# Patient Record
Sex: Male | Born: 2000 | Race: White | Hispanic: No | Marital: Single | Smoking: Never smoker
Health system: Southern US, Community
[De-identification: ages and names within clinical notes are randomized; demographics above are authoritative.]

## PROBLEM LIST (undated history)

## (undated) DIAGNOSIS — S62101B Fracture of unspecified carpal bone, right wrist, initial encounter for open fracture: Secondary | ICD-10-CM

---
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
## 2020-08-22 IMAGING — DX DG WRIST COMPLETE 3+V*L*
4 series · 4 of 4 positions shown · non-contrast
Comparison: None.

CLINICAL DATA: Left wrist pain.  Fall.

EXAM:
LEFT WRIST - COMPLETE 3+ VIEW

[wrist ap (1 of 2)]
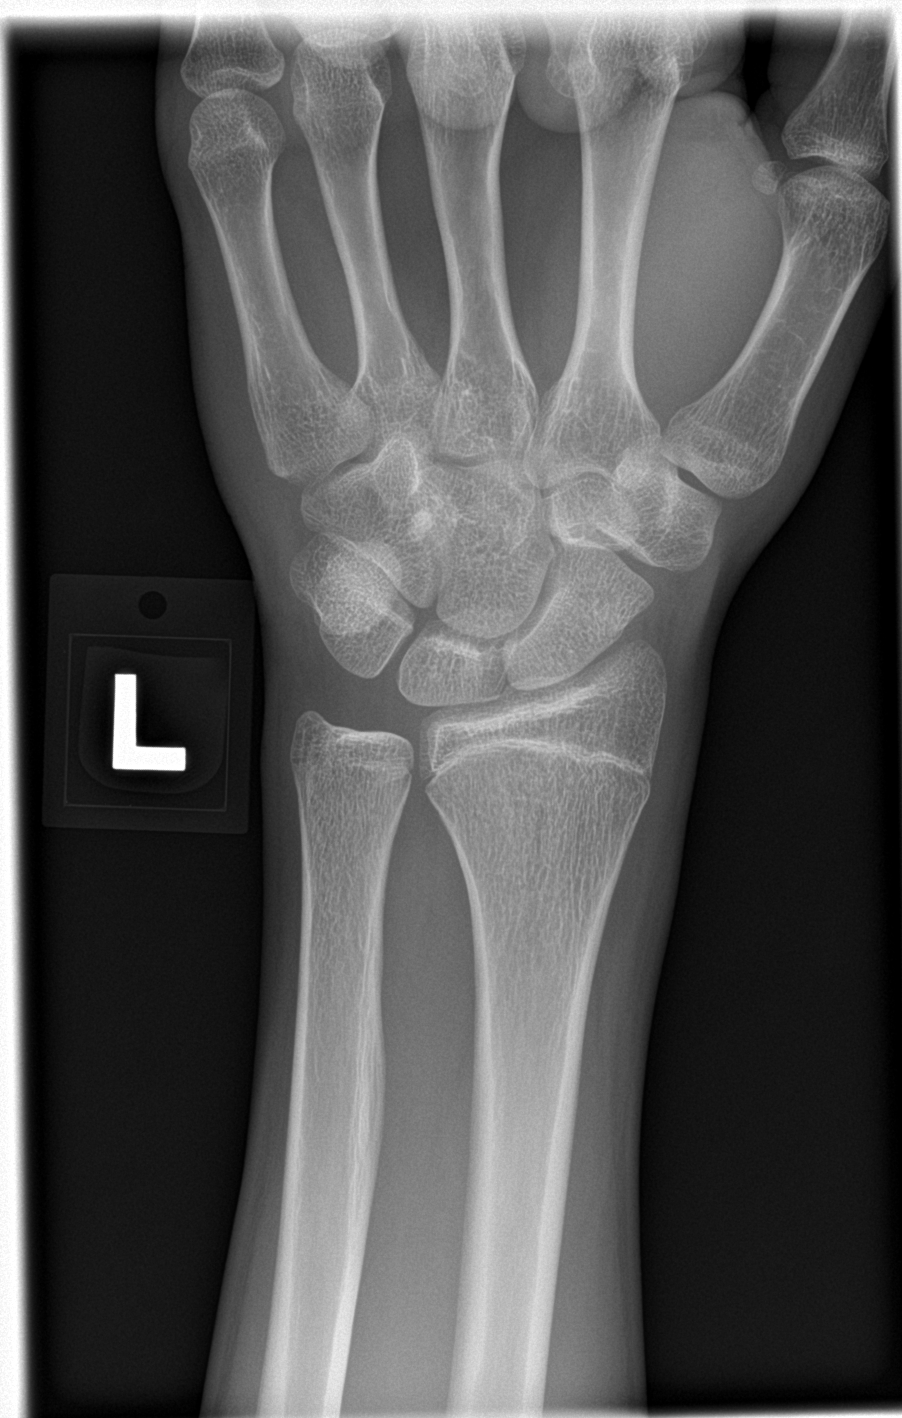

[wrist obl]
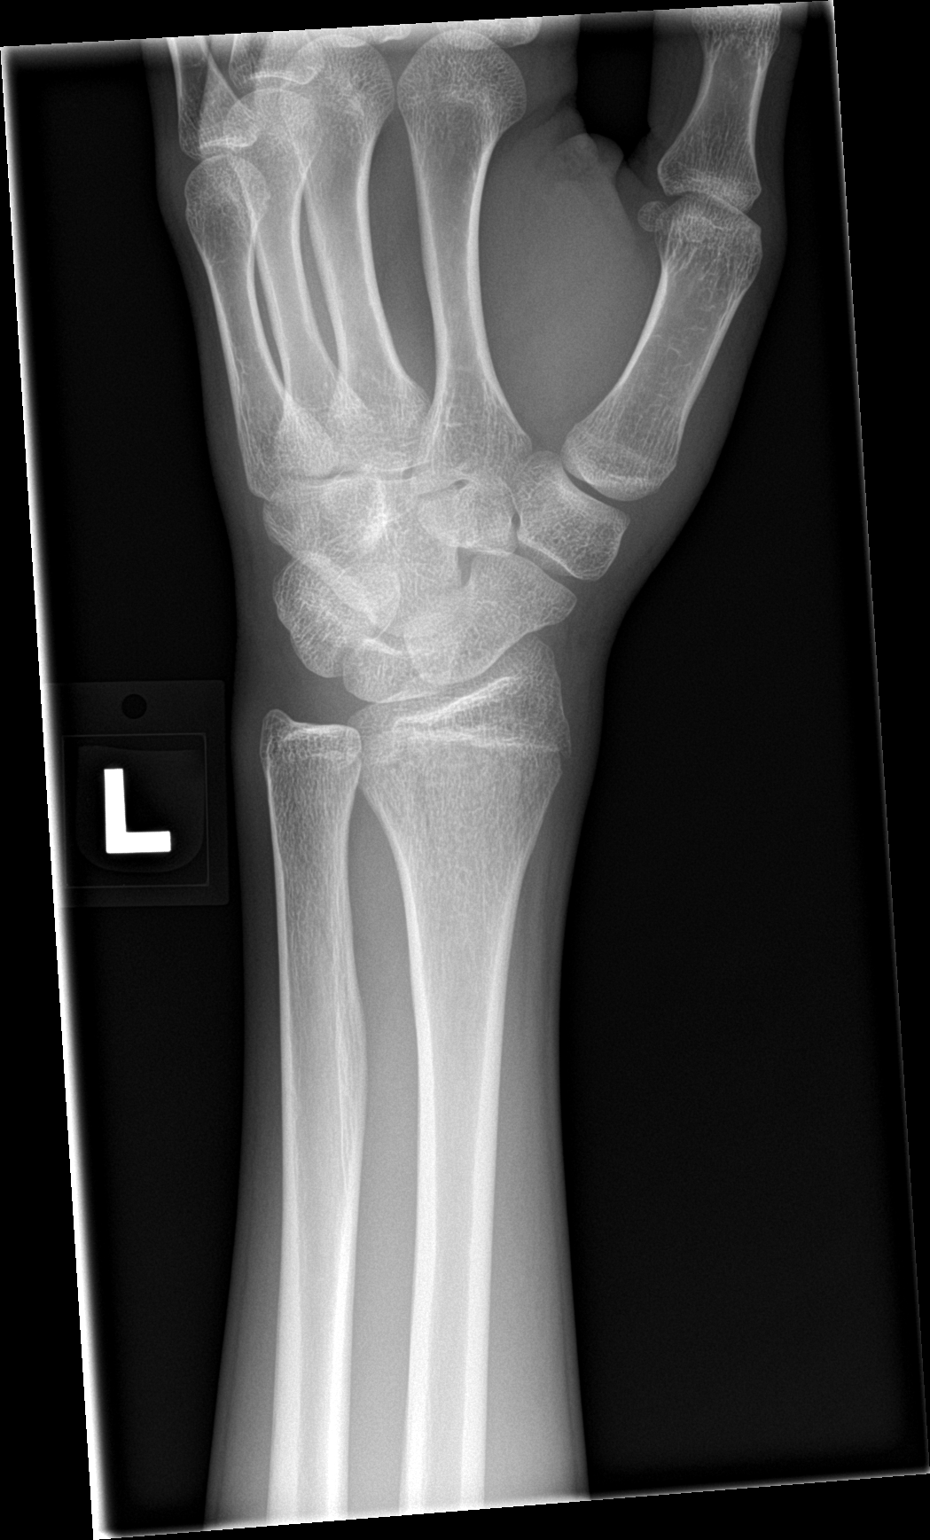

[wrist lat]
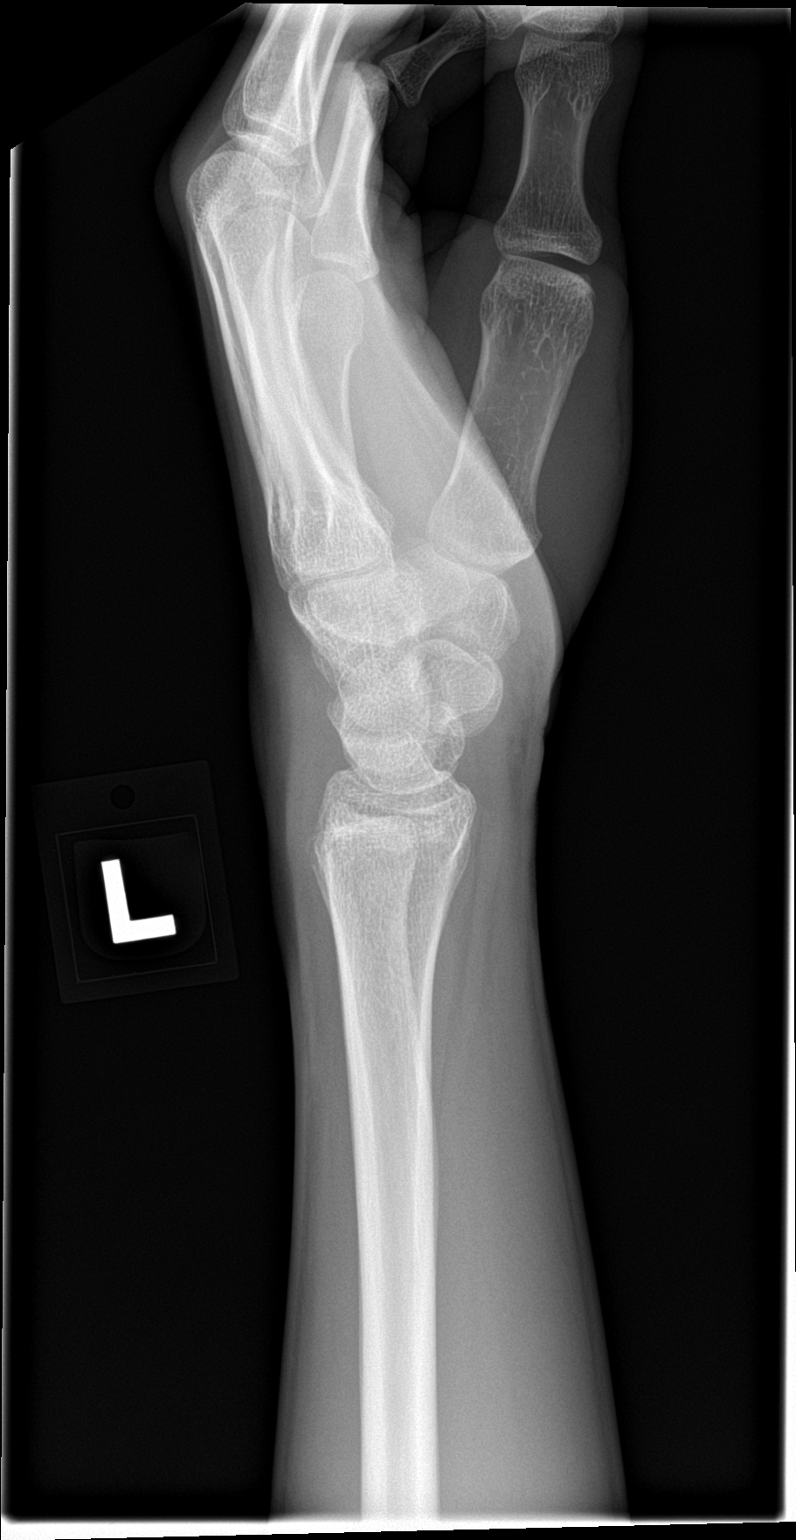

[wrist ap (2 of 2)]
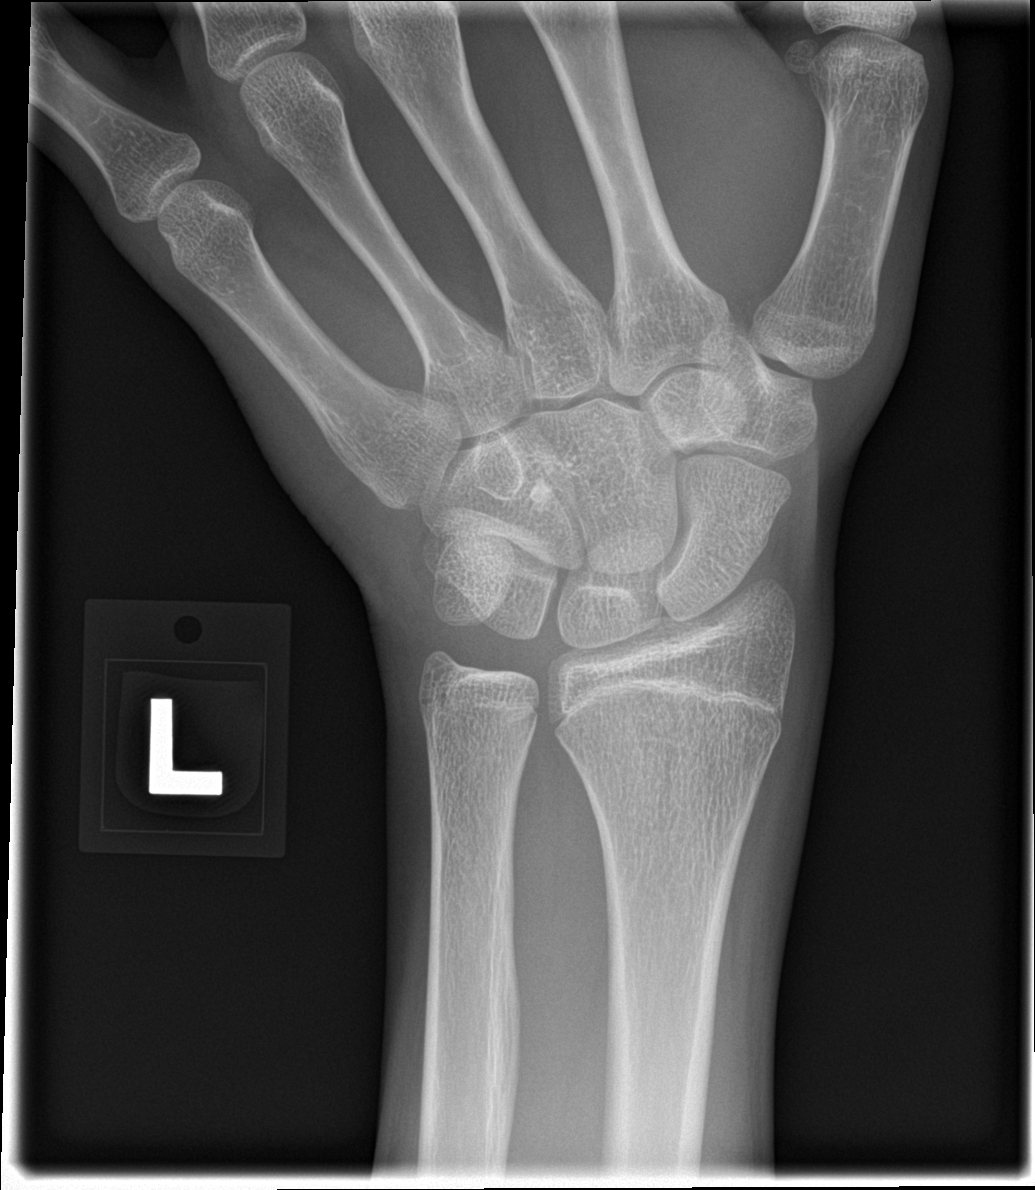

[4 of 4 positions shown; findings below may reference images not displayed]

FINDINGS: There is no evidence of fracture or dislocation. There is no
evidence of arthropathy or other focal bone abnormality. Soft
tissues are unremarkable.
IMPRESSION: Negative.

## 2022-08-05 ENCOUNTER — Ambulatory Visit (INDEPENDENT_AMBULATORY_CARE_PROVIDER_SITE_OTHER): Payer: BLUE CROSS/BLUE SHIELD | Admitting: Adult Health

## 2022-08-05 ENCOUNTER — Encounter: Payer: Self-pay | Admitting: Adult Health

## 2022-08-05 VITALS — BP 97/65 | HR 68 | Temp 96.7°F | Ht 75.0 in | Wt 180.0 lb

## 2022-08-05 DIAGNOSIS — S069X0A Unspecified intracranial injury without loss of consciousness, initial encounter: Secondary | ICD-10-CM | POA: Diagnosis not present

## 2022-08-05 NOTE — Progress Notes (Signed)
[facility] [place], [place] [address] Phone: [phone] Fax: [phone]   Office Visit Note  Patient Name: Aaron Jefferson  Date of Birth [birth year]  Med Record Number [id number]  Date of Service: 08/05/2022  Chief Complaint  Patient presents with   Concussion    Vital Signs: BP 97/65   Pulse 68   Temp (!) 96.7 F (35.9 C) (Tympanic)   Ht 6\' 3"  (1.905 m)   Wt 180 lb (81.6 kg)   SpO2 98%   BMI 22.50 kg/m   Patient has no known allergies.  Current Medication:  No outpatient encounter medications on file as of 08/05/2022.   No facility-administered encounter medications on file as of 08/05/2022.    Medical History: Past Medical History:  Diagnosis Date   Broken wrist, right, open, initial encounter 2017    Acute Concussion Evaluation (ACE)  Date/Time of Injury 08/04/2022, 9pm Reported by Patient  Injury Description he was playing intermurral soccer game yesterday.  He had two games back to back, and in both games he went up to do headers, and hit someone else's head in the back of his.   Is there evidence of a forcible blow to the head (direct or indirect)? No Evidence of Intracranial injury or skill fracture? No Location of impact Occipital Causes Sports  Amnesia Before (Retrograde) Are there events just BEFORE the injury that you/person has no memory of (even brief)? Yes Amnesia After (Anterograde) Are there any events just AFTER the injury that you/person has no memory of (even brief)? Yes Loss of Consciousness No Early signs: N/A Seizures: No  Headache: 1  Nausea: 1  Vomiting: 0  Balance Difficulty: 1  Dizziness: 1  Visual Problems:1  Fatigue: 1  Sensitivity to light: 1  Sensitivity to noise: 0  Numbness/Tingling: 0    Daytime Drowsiness: 1  Sleep Less Than Usual: 0  Sleep More Than Usual: 0  Trouble Falling Asleep: 0    Irritability: 1  Sadness: 1  Feeling More Emotional: 0  Nervousness: 0    Feeling Mentally  Foggy: 1  Feeling Slowed Down: 1  Difficulty Concentrating: 1  Difficulty Remembering: 0   Total Symptom Score: 13  Does Exertion/Physical Activity makes symptoms worse? No Does Cognitive Activity (reading, writing, studying) make symptoms worse?Yes Overall Rating: How different is the person acting compared to his/her usual self? 3  Risk Factors for Protracted Recovery  History of Concussion? No How many Previous Concussions? 0 Longest symptom duration N/A If multiple concussions, was less force needed to cause reinjury? No History of Headaches?No Prior treatment for headaches?No Personal History of Migraines?No Family History of Migraines Yes, mother  History of Learning Disabilities?No History of ADD or ADHD?No Other Developmental disorders? No   History of Anxiety?No History of Depression?No Sleep Disorder?No Other Psychiatric DisorderNo    Physical EXAM Physical Exam Vitals and nursing note reviewed.  Constitutional:      Appearance: He is normal weight.  HENT:     Head: Normocephalic.     Right Ear: Tympanic membrane and ear canal normal.     Left Ear: Tympanic membrane and ear canal normal.     Nose: Nose normal.     Mouth/Throat:     Mouth: Mucous membranes are moist.  Eyes:     Extraocular Movements: Extraocular movements intact.     Pupils: Pupils are equal, round, and reactive to light.  Cardiovascular:     Rate and Rhythm: Normal rate.  Pulmonary:  Effort: Pulmonary effort is normal.  Neurological:     Mental Status: He is alert.     Assessment/Plan: 1. Traumatic brain injury, without loss of consciousness, initial encounter (HCC) Ace Score is 13.   Will follow up with in days.  Findings consistent with Concussion/mild TBI.  Unfit for class and will need extensions on assignments/tests.  Discussed with patient the importance of physical and mental rest for recovery.  Reviewed Head Injury protocol, including limiting screen time, frequent rest  breaks, cognitive rest. Avoid physical activity, bright light, loud noise and alcohol consumption. May take tylenol for headache. Encouraged patient to rest, take naps as needed and drink plenty of water.   Refer to the emergency department with sudden onset of any of the following; Headaches that worsen, seizures, looks very drowsy/can't be awakened, repeated vomiting, slurred speech, can't recognize people or places, increasing confusion or irritability, weakness or numbness in arms/legs, neck pain, unusual behavior change, or change in state of consciousness.        General Counseling: jovian lembcke understanding of the findings of todays visit and agrees with plan of treatment. I have discussed any further diagnostic evaluation that may be needed or ordered today. We also reviewed his medications today. he has been encouraged to call the office with any questions or concerns that should arise related to todays visit.   No orders of the defined types were placed in this encounter.   No orders of the defined types were placed in this encounter.   Time spent:25 Minutes Time spent includes review of chart, medications, test results, and follow up plan with the patient.    Johnna Acosta AGNP-C Nurse Practitioner

## 2022-08-07 ENCOUNTER — Ambulatory Visit (INDEPENDENT_AMBULATORY_CARE_PROVIDER_SITE_OTHER): Payer: BLUE CROSS/BLUE SHIELD | Admitting: Adult Health

## 2022-08-07 ENCOUNTER — Encounter: Payer: Self-pay | Admitting: Adult Health

## 2022-08-07 VITALS — BP 114/70 | HR 64 | Temp 96.3°F

## 2022-08-07 DIAGNOSIS — S069X0A Unspecified intracranial injury without loss of consciousness, initial encounter: Secondary | ICD-10-CM

## 2022-08-07 NOTE — Progress Notes (Signed)
[facility] [place], [place] [address] Phone: [phone] Fax: [phone]   Office Visit Note  Patient Name: Aaron Jefferson  Date of Birth [birth year]  Med Record Number [id number]  Date of Service: 08/07/2022  Chief Complaint  Patient presents with   Follow-up    Vital Signs: BP 114/70   Pulse 64   Temp (!) 96.3 F (35.7 C) (Tympanic)   SpO2 99%   Patient has no known allergies.  Current Medication:  No outpatient encounter medications on file as of 08/07/2022.   No facility-administered encounter medications on file as of 08/07/2022.    Medical History: Past Medical History:  Diagnosis Date   Broken wrist, right, open, initial encounter 2017    Acute Concussion Evaluation (ACE)  Date/Time of Injury 08/04/2022, 9pm Reported by Patient   Injury Description he was playing intermurral soccer game yesterday.  He had two games back to back, and in both games he went up to do headers, and hit someone else's head in the back of his.    Is there evidence of a forcible blow to the head (direct or indirect)? No Evidence of Intracranial injury or skill fracture? No Location of impact Occipital Causes Sports   Amnesia Before (Retrograde) Are there events just BEFORE the injury that you/person has no memory of (even brief)? Yes Amnesia After (Anterograde) Are there any events just AFTER the injury that you/person has no memory of (even brief)? Yes Loss of Consciousness No Early signs: N/A Seizures: No  Headache: 0  Nausea: 0  Vomiting: 0  Balance Difficulty: 0  Dizziness: 1  Visual Problems:1  Fatigue: 1  Sensitivity to light: 1  Sensitivity to noise: 0  Numbness/Tingling: 0    Daytime Drowsiness: 1  Sleep Less Than Usual: 0  Sleep More Than Usual: 0  Trouble Falling Asleep: 0    Irritability: 1  Sadness: 1  Feeling More Emotional: 0  Nervousness: 0    Feeling Mentally Foggy: 1  Feeling Slowed Down: 0  Difficulty  Concentrating: 0  Difficulty Remembering: 0   Total Symptom Score: 8  Does Exertion/Physical Activity makes symptoms worse? Yes Does Cognitive Activity (reading, writing, studying) make symptoms worse?YES Overall Rating: How different is the person acting compared to his/her usual self? 2    Risk Factors for Protracted Recovery  History of Concussion? No How many Previous Concussions? 0 Longest symptom duration N/A If multiple concussions, was less force needed to cause reinjury? No History of Headaches?No Prior treatment for headaches?No Personal History of Migraines?No Family History of Migraines Yes, mother   History of Learning Disabilities?No History of ADD or ADHD?No Other Developmental disorders? No    History of Anxiety?No History of Depression?No Sleep Disorder?No Other Psychiatric DisorderNo    Physical EXAM Physical Exam Vitals and nursing note reviewed.  Constitutional:      Appearance: Normal appearance.  HENT:     Head: Normocephalic.     Right Ear: Tympanic membrane and ear canal normal.     Left Ear: Tympanic membrane and ear canal normal.     Mouth/Throat:     Mouth: Mucous membranes are moist.  Eyes:     Conjunctiva/sclera: Conjunctivae normal.     Pupils: Pupils are equal, round, and reactive to light.  Neurological:     General: No focal deficit present.     Mental Status: He is alert and oriented to person, place, and time. Mental status is at baseline.     Sensory: No  sensory deficit.     Motor: No weakness.     Coordination: Coordination normal.     Gait: Gait normal.    Assessment/Plan: 1. Traumatic brain injury, without loss of consciousness, initial encounter (HCC) Ace now 8, down from 13.  Continue plan as discussed.   Findings consistent with Concussion/mild TBI.  Unfit for class and will need extensions on assignments/tests.  Discussed with patient the importance of physical and mental rest for recovery.  Reviewed Head Injury  protocol, including limiting screen time, frequent rest breaks, cognitive rest. Avoid physical activity, bright light, loud noise and alcohol consumption. May take tylenol for headache. Encouraged patient to rest, take naps as needed and drink plenty of water.   Refer to the emergency department with sudden onset of any of the following; Headaches that worsen, seizures, looks very drowsy/can't be awakened, repeated vomiting, slurred speech, can't recognize people or places, increasing confusion or irritability, weakness or numbness in arms/legs, neck pain, unusual behavior change, or change in state of consciousness.        General Counseling: Aaron Jefferson understanding of the findings of todays visit and agrees with plan of treatment. I have discussed any further diagnostic evaluation that may be needed or ordered today. We also reviewed his medications today. he has been encouraged to call the office with any questions or concerns that should arise related to todays visit.   No orders of the defined types were placed in this encounter.   No orders of the defined types were placed in this encounter.   Time spent:20 Minutes Time spent includes review of chart, medications, test results, and follow up plan with the patient.    Aaron Jefferson AGNP-C Nurse Practitioner

## 2022-11-27 ENCOUNTER — Encounter: Payer: Self-pay | Admitting: Medical

## 2022-11-27 ENCOUNTER — Other Ambulatory Visit: Payer: Self-pay

## 2022-11-27 ENCOUNTER — Ambulatory Visit (INDEPENDENT_AMBULATORY_CARE_PROVIDER_SITE_OTHER): Payer: BLUE CROSS/BLUE SHIELD | Admitting: Medical

## 2022-11-27 VITALS — BP 118/62 | HR 90 | Temp 98.9°F | Ht 74.8 in | Wt 175.0 lb

## 2022-11-27 DIAGNOSIS — J101 Influenza due to other identified influenza virus with other respiratory manifestations: Secondary | ICD-10-CM | POA: Diagnosis not present

## 2022-11-27 DIAGNOSIS — R051 Acute cough: Secondary | ICD-10-CM | POA: Diagnosis not present

## 2022-11-27 LAB — POCT INFLUENZA A/B
Influenza A, POC: POSITIVE — AB
Influenza B, POC: NEGATIVE

## 2022-11-27 MED ORDER — ALBUTEROL SULFATE HFA 108 (90 BASE) MCG/ACT IN AERS: 1.0000 | INHALATION_SPRAY | RESPIRATORY_TRACT | 0 refills | Status: AC | PRN

## 2022-11-27 NOTE — Patient Instructions (Signed)
You have tested positive for Influenza. Influenza is contagious to others through respiratory droplets (i.e. coughing and sneezing). You should avoid close contact with others when possible until your symptoms resolve. We recommend you do not attend class until your fever (100.4 F or above) has resolved for at least 24 hours (without taking fever-reducing medicine). In the meantime, when you must leave your room/apartment (to get food or medicine, visit bathroom), you should wear a well-fitting mask (ideally N95 or KN95).  Even when you do return to class, you should continue wearing a mask when around others (i.e. when going to class or visiting public places on or off campus) until your symptoms resolve.   When you do need to miss class due to illness, you should email your professors directly to notify them of your illness and make arrangements to complete any missed work.  If you have concerns about missing class or need other assistance, email [employer] at [email or web address]. You can also look at their website: [email or web address]  -Rest and stay well hydrated (by drinking water and other liquids). Avoid/limit caffeine. -Take over-the-counter medicines (i.e. Mucinex DM, Nyquil, Ibuprofen) to help relieve your symptoms. -Use the albuterol inhaler every 4-6 hours as needed for chest tightness, wheezing, cough or shortness of breath.  -For your sore throat/cough, use cough drops/throat lozenges, gargle warm salt water and/or drink warm liquids (like tea with honey). -Send MyChart message to provider or schedule return visit as needed for new/worsening symptoms (i.e. shortness of breath, ear pain) or if your symptoms do not improve as discussed with recommended treatment over the next 5-7 days.

## 2022-11-27 NOTE — Progress Notes (Signed)
[facility] [place], [place] [address] Phone: [phone] Fax: [phone]   Office Visit Note  Patient Name: Aaron Jefferson  Date of [id number]  Med Rec number [id number]  Date of Service: 11/27/2022  Allergies: Patient has no known allergies.  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  Having fever (100-102.8), chills, decreased appetite, cough, fatigue, myalgias, HA, nasal congestion, mild runny nose and sore throat. Slightly SOB feeling, chest feels little tight. Cough was dry, now more productive in last day or so, little yellow-green mucus. No nausea, vomiting or diarrhea.  Sx began about 4 days ago. Feeling some better this morning.  Has been taking Advil and Tylenol. Also some Robitussin DM. No meds since yesterday evening. No flu shot this year. No known contacts with flu/COVID.   Had negative at-home COVID test yesterday. Did have asthma as a child, mainly exacerbated when sick. Does not have an inhaler currently. Sometimes smokes marijuana.    Current Medication:  No outpatient encounter medications on file as of 11/27/2022.   No facility-administered encounter medications on file as of 11/27/2022.      Medical History: Past Medical History:  Diagnosis Date   Broken wrist, right, open, initial encounter 2017     Vital Signs: BP 118/62   Pulse 90   Temp 98.9 F (37.2 C) (Tympanic)   Ht 6' 2.8" (1.9 m)   Wt 175 lb (79.4 kg)   SpO2 98%   BMI 21.99 kg/m    Review of Systems See HPI  Physical Exam Vitals reviewed.  Constitutional:      General: He is not in acute distress.    Comments: Tired appearing  HENT:     Head: Normocephalic.     Right Ear: Ear canal and external ear normal.     Left Ear: Ear canal and external ear normal.     Ears:     Comments: TMs slightly dull    Nose: Septal deviation, mucosal edema and congestion (mild) present.  No rhinorrhea.     Mouth/Throat:     Mouth: Mucous membranes are moist. No oral lesions.     Pharynx: Posterior oropharyngeal erythema (mild) present. No pharyngeal swelling.     Tonsils: No tonsillar exudate. 1+ on the right. 1+ on the left.  Cardiovascular:     Rate and Rhythm: Normal rate and regular rhythm.     Heart sounds: No murmur heard.    No friction rub. No gallop.  Pulmonary:     Effort: Pulmonary effort is normal.     Breath sounds: Normal breath sounds. No wheezing, rhonchi or rales.  Musculoskeletal:     Cervical back: Neck supple. No rigidity.  Lymphadenopathy:     Cervical: Cervical adenopathy (small anterior nodes) present.  Neurological:     Mental Status: He is alert.     Results for orders placed or performed in visit on 11/27/22 (from the past 24 hour(s))  POCT Influenza A/B     Status: Abnormal   Collection Time: 11/27/22 10:23 AM  Result Value Ref Range   Influenza A, POC Positive (A) Negative   Influenza B, POC Negative Negative     Assessment/Plan: 1. Influenza A 2. Acute cough POC Influenza A test faintly positive. Clinical findings consistent with flu. Discussed that antiviral treatment is unlikely to be helpful given symptom-onset >72 hours ago. Symptoms have begun to improve. Encouraged supportive  measures and over-the-counter meds as needed for symptom relief. Will give albuterol inhaler to use as needed. Okay to attend class tomorrow as long as fever does not return.  - POCT Influenza A/B - albuterol (VENTOLIN HFA) 108 (90 Base) MCG/ACT inhaler; Inhale 1-2 puffs into the lungs every 4 (four) hours as needed for wheezing or shortness of breath (or cough).  Dispense: 1 each; Refill: 0  Patient Instructions:  You have tested positive for Influenza. Influenza is contagious to others through respiratory droplets (i.e. coughing and sneezing). You should avoid close contact with others when possible until your symptoms resolve. We recommend you do not  attend class until your fever (100.4 F or above) has resolved for at least 24 hours (without taking fever-reducing medicine). In the meantime, when you must leave your room/apartment (to get food or medicine, visit bathroom), you should wear a well-fitting mask (ideally N95 or KN95).  Even when you do return to class, you should continue wearing a mask when around others (i.e. when going to class or visiting public places on or off campus) until your symptoms resolve.   When you do need to miss class due to illness, you should email your professors directly to notify them of your illness and make arrangements to complete any missed work.  If you have concerns about missing class or need other assistance, email [employer] at [email or web address]. You can also look at their website: [email or web address]  -Rest and stay well hydrated (by drinking water and other liquids). Avoid/limit caffeine. -Take over-the-counter medicines (i.e. Mucinex DM, Nyquil, Ibuprofen) to help relieve your symptoms. -Use the albuterol inhaler every 4-6 hours as needed for chest tightness, wheezing, cough or shortness of breath.  -For your sore throat/cough, use cough drops/throat lozenges, gargle warm salt water and/or drink warm liquids (like tea with honey). -Send MyChart message to provider or schedule return visit as needed for new/worsening symptoms (i.e. increased shortness of breath, ear pain) or if your symptoms do not improve as discussed with recommended treatment over the next 5-7 days.    General Counseling: travor royce understanding of the findings of todays visit and agrees with plan of treatment. he has been encouraged to call the office with any questions or concerns that should arise related to todays visit.   Time spent: [place] PA-C [place] 11/27/2022 9:51 AM
